# Patient Record
Sex: Female | Born: 1975 | Race: Asian | Hispanic: No | Marital: Married | Smoking: Former smoker
Health system: Southern US, Community
[De-identification: ages and names within clinical notes are randomized; demographics above are authoritative.]

## PROBLEM LIST (undated history)

## (undated) DIAGNOSIS — B019 Varicella without complication: Secondary | ICD-10-CM

## (undated) DIAGNOSIS — R32 Unspecified urinary incontinence: Secondary | ICD-10-CM

## (undated) DIAGNOSIS — G43909 Migraine, unspecified, not intractable, without status migrainosus: Secondary | ICD-10-CM

## (undated) DIAGNOSIS — K759 Inflammatory liver disease, unspecified: Secondary | ICD-10-CM

## (undated) DIAGNOSIS — N200 Calculus of kidney: Secondary | ICD-10-CM

## (undated) DIAGNOSIS — N390 Urinary tract infection, site not specified: Secondary | ICD-10-CM

## (undated) HISTORY — DX: Calculus of kidney: N20.0

## (undated) HISTORY — DX: Varicella without complication: B01.9

## (undated) HISTORY — DX: Unspecified urinary incontinence: R32

## (undated) HISTORY — DX: Inflammatory liver disease, unspecified: K75.9

## (undated) HISTORY — DX: Migraine, unspecified, not intractable, without status migrainosus: G43.909

## (undated) HISTORY — DX: Urinary tract infection, site not specified: N39.0

---

## 1997-05-23 ENCOUNTER — Inpatient Hospital Stay (HOSPITAL_COMMUNITY): Admission: RE | Admit: 1997-05-23 | Discharge: 1997-05-23 | Payer: Self-pay | Admitting: Obstetrics

## 1997-05-28 ENCOUNTER — Encounter (HOSPITAL_COMMUNITY)
Admission: RE | Admit: 1997-05-28 | Discharge: 1997-07-02 | Disposition: A | Discharge status: Home / Self Care | Payer: Self-pay | Attending: *Deleted | Admitting: *Deleted

## 1997-06-06 ENCOUNTER — Inpatient Hospital Stay (HOSPITAL_COMMUNITY): Admission: AD | Admit: 1997-06-06 | Discharge: 1997-06-06 | Payer: Self-pay | Admitting: Obstetrics

## 1997-06-28 ENCOUNTER — Inpatient Hospital Stay (HOSPITAL_COMMUNITY): Admission: AD | Admit: 1997-06-28 | Discharge: 1997-07-02 | Payer: Self-pay | Admitting: Obstetrics & Gynecology

## 1998-05-21 ENCOUNTER — Ambulatory Visit (HOSPITAL_COMMUNITY): Admission: RE | Admit: 1998-05-21 | Discharge: 1998-05-21 | Payer: Self-pay | Admitting: *Deleted

## 1998-06-13 ENCOUNTER — Ambulatory Visit (HOSPITAL_COMMUNITY): Admission: RE | Admit: 1998-06-13 | Discharge: 1998-06-13 | Payer: Self-pay | Admitting: Obstetrics

## 1998-08-16 ENCOUNTER — Inpatient Hospital Stay (HOSPITAL_COMMUNITY): Admission: AD | Admit: 1998-08-16 | Discharge: 1998-08-18 | Payer: Self-pay | Admitting: *Deleted

## 2000-05-10 ENCOUNTER — Ambulatory Visit (HOSPITAL_COMMUNITY): Admission: RE | Admit: 2000-05-10 | Discharge: 2000-05-10 | Payer: Self-pay | Admitting: Obstetrics

## 2007-10-26 ENCOUNTER — Ambulatory Visit (HOSPITAL_COMMUNITY): Admission: RE | Admit: 2007-10-26 | Discharge: 2007-10-26 | Payer: Self-pay | Admitting: Family Medicine

## 2008-03-10 ENCOUNTER — Inpatient Hospital Stay (HOSPITAL_COMMUNITY): Admission: AD | Admit: 2008-03-10 | Discharge: 2008-03-10 | Payer: Self-pay | Admitting: Obstetrics & Gynecology

## 2008-03-20 ENCOUNTER — Ambulatory Visit: Payer: Self-pay | Admitting: Obstetrics & Gynecology

## 2008-03-20 ENCOUNTER — Inpatient Hospital Stay (HOSPITAL_COMMUNITY): Admission: AD | Admit: 2008-03-20 | Discharge: 2008-03-22 | Payer: Self-pay | Admitting: Obstetrics & Gynecology

## 2008-04-20 HISTORY — PX: TUBAL LIGATION: SHX77

## 2008-10-25 ENCOUNTER — Emergency Department (HOSPITAL_COMMUNITY): Admission: EM | Admit: 2008-10-25 | Discharge: 2008-10-25 | Payer: Self-pay | Admitting: Emergency Medicine

## 2010-07-27 LAB — POCT PREGNANCY, URINE: Preg Test, Ur: NEGATIVE

## 2010-07-27 LAB — URINALYSIS, ROUTINE W REFLEX MICROSCOPIC
Bilirubin Urine: NEGATIVE
Glucose, UA: NEGATIVE mg/dL
Nitrite: POSITIVE — AB
Protein, ur: 30 mg/dL — AB
Specific Gravity, Urine: 1.026 (ref 1.005–1.030)
Urobilinogen, UA: 1 mg/dL (ref 0.0–1.0)
pH: 6 (ref 5.0–8.0)

## 2010-07-27 LAB — WET PREP, GENITAL
Trich, Wet Prep: NONE SEEN
Yeast Wet Prep HPF POC: NONE SEEN

## 2010-07-27 LAB — URINE MICROSCOPIC-ADD ON

## 2010-07-27 LAB — GC/CHLAMYDIA PROBE AMP, GENITAL
Chlamydia, DNA Probe: NEGATIVE
GC Probe Amp, Genital: NEGATIVE

## 2010-07-27 LAB — RPR: RPR Ser Ql: NONREACTIVE

## 2010-09-02 NOTE — Op Note (Signed)
Jody Small, Jody Small                 ACCOUNT NO.:  [id number]   MEDICAL RECORD NO.:  [id number]          PATIENT TYPE:  INP   LOCATION:  9146                          FACILITY:  [facility]   PHYSICIAN:  Norton Blizzard, MD    DATE OF BIRTH:  February 28, 1976   DATE OF PROCEDURE:  03/21/2008  DATE OF DISCHARGE:                               OPERATIVE REPORT   PREOPERATIVE DIAGNOSIS:  Multiparity, desiring sterilization.   POSTOPERATIVE DIAGNOSIS:  Multiparity, desiring sterilization.   PROCEDURE:  Postpartum bilateral tubal sterilization using Filshie  clips.   SURGEON:  Norton Blizzard, MD and Odie Sera, DO   ANESTHESIA:  Epidural and local analgesia in the form of 0.25% Marcaine,  15 mL injected around the incision at the end of the case.   IV FLUIDS:  800 mL of lactated Ringer's.   ESTIMATED BLOOD LOSS:  Minimal.   INDICATIONS:  The patient is a 35 year old gravida 4, para 4 with  undesired fertility.  The patient declined all other forms of reversible  and non-permanent birth control methods.  Prior to surgery, the risk of  bleeding, infection, injury to surrounding organs, need for additional  procedures, risk of failure of 5-7 in 1000 with increased risk of  ectopic gestation if pregnancy occurs were explained to the patient.  All questions were answered and written informed consent was obtained.  Of note, her Medicaid papers were signed on February 02, 2008.   FINDINGS:  Fundus below the umbilicus, normal fallopian tubes  bilaterally.   SPECIMENS:  None.   COMPLICATIONS:  None.   PROCEDURE DETAILS:  The patient received preoperative IV antibiotics in  the preoperative area.  She was then taken to the operating room where  her epidural was dosed up to a surgical level and found to be adequate.  An incision was made underneath the umbilicus about 3 cm in length and  transverse in fashion.  This incision was taken down to the layer of the  fascia using the scalpel and this  fascial incision was extended  bilaterally using Mayo scissors.  Attention was then turned to the  patient's uterus where the fallopian tubes bilaterally were recognized  and followed out to both fimbriated ends.  The right fallopian tube was  picked up with Tanja Port and a Filshie clip was placed about 3 cm from the  cornual region.  A similar process was carried out on the left side  providing total occlusion of both fallopian tubes.  Overall, good  hemostasis was noted.  All instruments were removed from the patient's  abdomen and the fascial incision was then reapproximated using 0-Vicryl  in a running stitch.  The subcutaneous tissues were then irrigated, and  skin was closed with 4-0 subcuticular stitch using Vicryl.  Local  analgesia in the form of 0.25% Marcaine which was injected around the  incision at the end of the case.  The patient tolerated the procedure  well.  Sponge, instrument, and needle counts were correct x2.  She was  taken to the recovery room awake and in stable condition.  Norton Blizzard, MD  Electronically Signed     UAD/MEDQ  D:  03/21/2008  T:  03/22/2008  Job:  [id number]

## 2010-12-28 ENCOUNTER — Emergency Department (HOSPITAL_COMMUNITY)
Admission: EM | Admit: 2010-12-28 | Discharge: 2010-12-29 | Disposition: A | Discharge status: Home / Self Care | Payer: 59 | Attending: Emergency Medicine | Admitting: Emergency Medicine

## 2010-12-28 DIAGNOSIS — F3289 Other specified depressive episodes: Secondary | ICD-10-CM | POA: Insufficient documentation

## 2010-12-28 DIAGNOSIS — F329 Major depressive disorder, single episode, unspecified: Secondary | ICD-10-CM | POA: Insufficient documentation

## 2010-12-28 DIAGNOSIS — T50901A Poisoning by unspecified drugs, medicaments and biological substances, accidental (unintentional), initial encounter: Secondary | ICD-10-CM | POA: Insufficient documentation

## 2010-12-28 DIAGNOSIS — F411 Generalized anxiety disorder: Secondary | ICD-10-CM | POA: Insufficient documentation

## 2010-12-28 DIAGNOSIS — T50904A Poisoning by unspecified drugs, medicaments and biological substances, undetermined, initial encounter: Secondary | ICD-10-CM | POA: Insufficient documentation

## 2010-12-28 DIAGNOSIS — D649 Anemia, unspecified: Secondary | ICD-10-CM | POA: Insufficient documentation

## 2010-12-29 LAB — DIFFERENTIAL
Basophils Relative: 0 % (ref 0–1)
Eosinophils Relative: 1 % (ref 0–5)
Lymphocytes Relative: 20 % (ref 12–46)
Lymphs Abs: 1.6 10*3/uL (ref 0.7–4.0)
Monocytes Relative: 6 % (ref 3–12)
Neutro Abs: 5.9 10*3/uL (ref 1.7–7.7)

## 2010-12-29 LAB — CBC
HCT: 29.5 % — ABNORMAL LOW (ref 36.0–46.0)
MCV: 61.6 fL — ABNORMAL LOW (ref 78.0–100.0)
Platelets: 392 10*3/uL (ref 150–400)
RBC: 4.79 MIL/uL (ref 3.87–5.11)
RDW: 18 % — ABNORMAL HIGH (ref 11.5–15.5)
WBC: 8.1 10*3/uL (ref 4.0–10.5)

## 2010-12-29 LAB — COMPREHENSIVE METABOLIC PANEL
ALT: 14 U/L (ref 0–35)
AST: 31 U/L (ref 0–37)
Albumin: 4.1 g/dL (ref 3.5–5.2)
Alkaline Phosphatase: 52 U/L (ref 39–117)
BUN: 15 mg/dL (ref 6–23)
Chloride: 102 mEq/L (ref 96–112)
Potassium: 3.5 mEq/L (ref 3.5–5.1)
Sodium: 134 mEq/L — ABNORMAL LOW (ref 135–145)
Total Bilirubin: 0.2 mg/dL — ABNORMAL LOW (ref 0.3–1.2)
Total Protein: 7.8 g/dL (ref 6.0–8.3)

## 2010-12-29 LAB — ACETAMINOPHEN LEVEL: Acetaminophen (Tylenol), Serum: <15 ug/mL (ref 10–30)

## 2010-12-29 LAB — RAPID URINE DRUG SCREEN, HOSP PERFORMED
Barbiturates: NOT DETECTED
Cocaine: NOT DETECTED

## 2011-01-23 LAB — CBC
Hemoglobin: 11.7 g/dL — ABNORMAL LOW (ref 12.0–15.0)
MCHC: 32.9 g/dL (ref 30.0–36.0)
Platelets: 228 10*3/uL (ref 150–400)
RDW: 15 % (ref 11.5–15.5)

## 2011-01-23 LAB — RPR: RPR Ser Ql: NONREACTIVE

## 2013-05-16 ENCOUNTER — Encounter: Payer: Self-pay | Admitting: Physician Assistant

## 2013-05-16 ENCOUNTER — Other Ambulatory Visit (INDEPENDENT_AMBULATORY_CARE_PROVIDER_SITE_OTHER): Payer: 59

## 2013-05-16 ENCOUNTER — Ambulatory Visit (INDEPENDENT_AMBULATORY_CARE_PROVIDER_SITE_OTHER): Payer: 59 | Admitting: Physician Assistant

## 2013-05-16 VITALS — BP 118/70 | HR 101 | Temp 100.0°F | Resp 20 | Ht 62.0 in | Wt 118.1 lb

## 2013-05-16 DIAGNOSIS — Z0000 Encounter for general adult medical examination without abnormal findings: Secondary | ICD-10-CM

## 2013-05-16 DIAGNOSIS — D509 Iron deficiency anemia, unspecified: Secondary | ICD-10-CM

## 2013-05-16 DIAGNOSIS — R059 Cough, unspecified: Secondary | ICD-10-CM

## 2013-05-16 DIAGNOSIS — R05 Cough: Secondary | ICD-10-CM

## 2013-05-16 LAB — HEPATIC FUNCTION PANEL
ALBUMIN: 3.8 g/dL (ref 3.5–5.2)
ALT: 15 U/L (ref 0–35)
AST: 24 U/L (ref 0–37)
Alkaline Phosphatase: 61 U/L (ref 39–117)
BILIRUBIN TOTAL: 0.4 mg/dL (ref 0.3–1.2)
Bilirubin, Direct: 0 mg/dL (ref 0.0–0.3)
TOTAL PROTEIN: 7.5 g/dL (ref 6.0–8.3)

## 2013-05-16 LAB — CBC WITH DIFFERENTIAL/PLATELET
BASOS ABS: 0 10*3/uL (ref 0.0–0.1)
Basophils Relative: 0.3 % (ref 0.0–3.0)
Eosinophils Absolute: 0.1 10*3/uL (ref 0.0–0.7)
Eosinophils Relative: 1.8 % (ref 0.0–5.0)
LYMPHS ABS: 1.6 10*3/uL (ref 0.7–4.0)
Lymphocytes Relative: 27 % (ref 12.0–46.0)
MCHC: 31 g/dL (ref 30.0–36.0)
MCV: 57.3 fl — ABNORMAL LOW (ref 78.0–100.0)
MONOS PCT: 6.9 % (ref 3.0–12.0)
Monocytes Absolute: 0.4 10*3/uL (ref 0.1–1.0)
NEUTROS ABS: 3.9 10*3/uL (ref 1.4–7.7)
Neutrophils Relative %: 64 % (ref 43.0–77.0)
Platelets: 388 10*3/uL (ref 150.0–400.0)
RBC: 4.16 Mil/uL (ref 3.87–5.11)
RDW: 18.5 % — AB (ref 11.5–14.6)
WBC: 6.1 10*3/uL (ref 4.5–10.5)

## 2013-05-16 LAB — BASIC METABOLIC PANEL
BUN: 13 mg/dL (ref 6–23)
CO2: 26 mEq/L (ref 19–32)
Calcium: 8.9 mg/dL (ref 8.4–10.5)
Chloride: 107 mEq/L (ref 96–112)
Creatinine, Ser: 0.6 mg/dL (ref 0.4–1.2)
GFR: 122.46 mL/min (ref >60.00)
Glucose, Bld: 114 mg/dL — ABNORMAL HIGH (ref 70–99)
POTASSIUM: 3.2 meq/L — AB (ref 3.5–5.1)
SODIUM: 137 meq/L (ref 135–145)

## 2013-05-16 LAB — URINALYSIS, ROUTINE W REFLEX MICROSCOPIC
BILIRUBIN URINE: NEGATIVE
Hgb urine dipstick: NEGATIVE
KETONES UR: NEGATIVE
LEUKOCYTES UA: NEGATIVE
Nitrite: NEGATIVE
PH: 6 (ref 5.0–8.0)
Specific Gravity, Urine: >=1.03 — AB (ref 1.000–1.030)
Total Protein, Urine: NEGATIVE
URINE GLUCOSE: NEGATIVE
Urobilinogen, UA: 0.2 (ref 0.0–1.0)

## 2013-05-16 LAB — LIPID PANEL
CHOLESTEROL: 141 mg/dL (ref 0–200)
HDL: 54.3 mg/dL (ref >39.00)
LDL CALC: 76 mg/dL (ref 0–99)
TRIGLYCERIDES: 53 mg/dL (ref 0.0–149.0)
Total CHOL/HDL Ratio: 3
VLDL: 10.6 mg/dL (ref 0.0–40.0)

## 2013-05-16 LAB — TSH: TSH: 0.97 u[IU]/mL (ref 0.35–5.50)

## 2013-05-16 NOTE — Progress Notes (Signed)
   Patient ID: Jody Small is a 38 y.o. female DOB: [phone]/[id number]/05 MRN: [id number]     HPI: patient is a 38 year old female who presents to the clinic today to establish care. Patient states she has not had a PCP in a number of years. Reports generally in good health. 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 Reports history of migraine which resolve easily with Excedrin Migraine medication and rest. States she has had a cough for the last two months which is sometimes productive of mucus, color unknown. States she has had an increase in fatigue also. Reports she was seen at local urgent care, informed had URI and provided Rx for inhaler and nasal spray. Patient states she has had no improvement of symptoms since. Denies nasal congestion/drainage, ear pain/pressure, facial pain, chest pain/palpitations, SOB, history of allergies/asthma N/V, diarrhea or constipation. Patient would like referral to gynecology.  ROS: As stated in HPI. All other systems negative  PE: CONSTITUTIONAL: Well developed, well nourished, pleasant, appears stated age, in NAD HEENT: normocephalic, atraumatic, bilateral ext/int canals normal. Bilateral TM's without injections, bulging, erythema. Nose normal, uvula midline, oropharynx clear and moist. EYES: PERRLA, bilateral EOM and conjunctiva normal NECK: FROM, supple, without thyromegaly or mass CARDIO: RRR, normal S1 and S2, distal pulses intact. PULM/CHEST CTA bilateral, no wheezes, rales or rhonchi. Non tender. ABD: appearance normal, soft, nontender. Normal bowel sounds x 4 quadrants GU: deferred to GYN MUSC: FROM U/LE bilateral LYMPH: no cervical, supraclavicular adenopathy NEURO: alert and oriented x 3, no cranial nerve deficit, motor strength and coordination NL. Negative romberg. Gait normal. SKIN: warm, dry, no rash or lesions noted. PSYCH: Mood and affect normal, speech normal.  Past Medical History  Diagnosis Date  . Chicken pox   . Hepatitis   . Migraines    . Kidney stones   . Urine incontinence   . UTI (urinary tract infection)    History reviewed. No pertinent family history. History   Social History  . Marital Status: Married    Spouse Name: N/A    Number of Children: N/A  . Years of Education: N/A   Social History Main Topics  . Smoking status: Former Smoker    Quit date: 05/16/1998  . Smokeless tobacco: None  . Alcohol Use: .5 - 1.5 oz/week    1-3 drink(s) per week     Comment: Social  . Drug Use: No  . Sexual Activity: Yes    Partners: Male    Birth Control/ Protection: Other-see comments     Comment: Tube Tie 5 yrs ago   Other Topics Concern  . None   Social History Narrative  . None   Past Surgical History  Procedure Laterality Date  . Tubal ligation  2010    ASSESSMENT and PLAN   CPX/v70.0 - Patient has been counseled on age-appropriate routine health concerns for screening and prevention. These are reviewed and up-to-date. Immunizations are up-to-date or declined. Labs ordered and will be reviewed.  Questioned patient on diet for today, states all she has had is a bowl of rice with fish paste, still ordered Lipid panel.  Cough: Patient febrile, 100 *F, and tachy at 101 bpm, ordered cxr, informed patient if necessary will eRx for antibiotics. Otherwise symptomatic treatment with previously prescribed inhaler along with cough suppressant and Claritin.   Patient stated understanding and agreement of diagnosis and treatment.

## 2013-05-16 NOTE — Patient Instructions (Signed)
It was great meeting you today Jody Small!   Cough, Adult  A cough is a reflex. It helps you clear your throat and airways. A cough can help heal your body. A cough can last 2 or 3 weeks (acute) or may last more than 8 weeks (chronic). Some common causes of a cough can include an infection, allergy, or a cold. HOME CARE  Only take medicine as told by your doctor.  If given, take your medicines (antibiotics) as told. Finish them even if you start to feel better.  Use a cold steam vaporizer or humidier in your home. This can help loosen thick spit (secretions).  Sleep so you are almost sitting up (semi-upright). Use pillows to do this. This helps reduce coughing.  Rest as needed.  Stop smoking if you smoke. GET HELP RIGHT AWAY IF:  You have yellowish-white fluid (pus) in your thick spit.  Your cough gets worse.  Your medicine does not reduce coughing, and you are losing sleep.  You cough up blood.  You have trouble breathing.  Your pain gets worse and medicine does not help.  You have a fever. MAKE SURE YOU:   Understand these instructions.  Will watch your condition.  Will get help right away if you are not doing well or get worse. Document Released: 12/18/2010 Document Revised: 06/29/2011 Document Reviewed: 12/18/2010 [facility] Patient Information 2014 [place].    Health Maintenance, Female A healthy lifestyle and preventative care can promote health and wellness.  Maintain regular health, dental, and eye exams.  Eat a healthy diet. Foods like vegetables, fruits, whole grains, low-fat dairy products, and lean protein foods contain the nutrients you need without too many calories. Decrease your intake of foods high in solid fats, added sugars, and salt. Get information about a proper diet from your caregiver, if necessary.  Regular physical exercise is one of the most important things you can do for your health. Most adults should get at least 150 minutes of  moderate-intensity exercise (any activity that increases your heart rate and causes you to sweat) each week. In addition, most adults need muscle-strengthening exercises on 2 or more days a week.   Maintain a healthy weight. The body mass index (BMI) is a screening tool to identify possible weight problems. It provides an estimate of body fat based on height and weight. Your caregiver can help determine your BMI, and can help you achieve or maintain a healthy weight. For adults 20 years and older:  A BMI below 18.5 is considered underweight.  A BMI of 18.5 to 24.9 is normal.  A BMI of 25 to 29.9 is considered overweight.  A BMI of 30 and above is considered obese.  Maintain normal blood lipids and cholesterol by exercising and minimizing your intake of saturated fat. Eat a balanced diet with plenty of fruits and vegetables. Blood tests for lipids and cholesterol should begin at age 59 and be repeated every 5 years. If your lipid or cholesterol levels are high, you are over 50, or you are a high risk for heart disease, you may need your cholesterol levels checked more frequently.Ongoing high lipid and cholesterol levels should be treated with medicines if diet and exercise are not effective.  If you smoke, find out from your caregiver how to quit. If you do not use tobacco, do not start.  Lung cancer screening is recommended for adults aged 47 80 years who are at high risk for developing lung cancer because of a history  of smoking. Yearly low-dose computed tomography (CT) is recommended for people who have at least a 30-pack-year history of smoking and are a current smoker or have quit within the past 15 years. A pack year of smoking is smoking an average of 1 pack of cigarettes a day for 1 year (for example: 1 pack a day for 30 years or 2 packs a day for 15 years). Yearly screening should continue until the smoker has stopped smoking for at least 15 years. Yearly screening should also be stopped  for people who develop a health problem that would prevent them from having lung cancer treatment.  If you are pregnant, do not drink alcohol. If you are breastfeeding, be very cautious about drinking alcohol. If you are not pregnant and choose to drink alcohol, do not exceed 1 drink per day. One drink is considered to be 12 ounces (355 mL) of beer, 5 ounces (148 mL) of wine, or 1.5 ounces (44 mL) of liquor.  Avoid use of street drugs. Do not share needles with anyone. Ask for help if you need support or instructions about stopping the use of drugs.  High blood pressure causes heart disease and increases the risk of stroke. Blood pressure should be checked at least every 1 to 2 years. Ongoing high blood pressure should be treated with medicines, if weight loss and exercise are not effective.  If you are 9 to 37 years old, ask your caregiver if you should take aspirin to prevent strokes.  Diabetes screening involves taking a blood sample to check your fasting blood sugar level. This should be done once every 3 years, after age 39, if you are within normal weight and without risk factors for diabetes. Testing should be considered at a younger age or be carried out more frequently if you are overweight and have at least 1 risk factor for diabetes.  Breast cancer screening is essential preventative care for women. You should practice "breast self-awareness." This means understanding the normal appearance and feel of your breasts and may include breast self-examination. Any changes detected, no matter how small, should be reported to a caregiver. Women in their 88s and 30s should have a clinical breast exam (CBE) by a caregiver as part of a regular health exam every 1 to 3 years. After age 30, women should have a CBE every year. Starting at age 26, women should consider having a mammogram (breast X-ray) every year. Women who have a family history of breast cancer should talk to their caregiver about genetic  screening. Women at a high risk of breast cancer should talk to their caregiver about having an MRI and a mammogram every year.  Breast cancer gene (BRCA)-related cancer risk assessment is recommended for women who have family members with BRCA-related cancers. BRCA-related cancers include breast, ovarian, tubal, and peritoneal cancers. Having family members with these cancers may be associated with an increased risk for harmful changes (mutations) in the breast cancer genes BRCA1 and BRCA2. Results of the assessment will determine the need for genetic counseling and BRCA1 and BRCA2 testing.  The Pap test is a screening test for cervical cancer. Women should have a Pap test starting at age 88. Between ages 16 and 39, Pap tests should be repeated every 2 years. Beginning at age 73, you should have a Pap test every 3 years as long as the past 3 Pap tests have been normal. If you had a hysterectomy for a problem that was not cancer or a condition  that could lead to cancer, then you no longer need Pap tests. If you are between ages 37 and 77, and you have had normal Pap tests going back 10 years, you no longer need Pap tests. If you have had past treatment for cervical cancer or a condition that could lead to cancer, you need Pap tests and screening for cancer for at least 20 years after your treatment. If Pap tests have been discontinued, risk factors (such as a new sexual partner) need to be reassessed to determine if screening should be resumed. Some women have medical problems that increase the chance of getting cervical cancer. In these cases, your caregiver may recommend more frequent screening and Pap tests.  The human papillomavirus (HPV) test is an additional test that may be used for cervical cancer screening. The HPV test looks for the virus that can cause the cell changes on the cervix. The cells collected during the Pap test can be tested for HPV. The HPV test could be used to screen women aged 66  years and older, and should be used in women of any age who have unclear Pap test results. After the age of 1, women should have HPV testing at the same frequency as a Pap test.  Colorectal cancer can be detected and often prevented. Most routine colorectal cancer screening begins at the age of 68 and continues through age 51. However, your caregiver may recommend screening at an earlier age if you have risk factors for colon cancer. On a yearly basis, your caregiver may provide home test kits to check for hidden blood in the stool. Use of a small camera at the end of a tube, to directly examine the colon (sigmoidoscopy or colonoscopy), can detect the earliest forms of colorectal cancer. Talk to your caregiver about this at age 28, when routine screening begins. Direct examination of the colon should be repeated every 5 to 10 years through age 46, unless early forms of pre-cancerous polyps or small growths are found.  Hepatitis C blood testing is recommended for all people born from [birth year] through 1964 and any individual with known risks for hepatitis C.  Practice safe sex. Use condoms and avoid high-risk sexual practices to reduce the spread of sexually transmitted infections (STIs). Sexually active women aged 34 and younger should be checked for Chlamydia, which is a common sexually transmitted infection. Older women with new or multiple partners should also be tested for Chlamydia. Testing for other STIs is recommended if you are sexually active and at increased risk.  Osteoporosis is a disease in which the bones lose minerals and strength with aging. This can result in serious bone fractures. The risk of osteoporosis can be identified using a bone density scan. Women ages 69 and over and women at risk for fractures or osteoporosis should discuss screening with their caregivers. Ask your caregiver whether you should be taking a calcium supplement or vitamin D to reduce the rate of  osteoporosis.  Menopause can be associated with physical symptoms and risks. Hormone replacement therapy is available to decrease symptoms and risks. You should talk to your caregiver about whether hormone replacement therapy is right for you.  Use sunscreen. Apply sunscreen liberally and repeatedly throughout the day. You should seek shade when your shadow is shorter than you. Protect yourself by wearing long sleeves, pants, a wide-brimmed hat, and sunglasses year round, whenever you are outdoors.  Notify your caregiver of new moles or changes in moles, especially if there is a  change in shape or color. Also notify your caregiver if a mole is larger than the size of a pencil eraser.  Stay current with your immunizations. Document Released: 10/20/2010 Document Revised: 08/01/2012 Document Reviewed: 10/20/2010 [facility] Patient Information 2014 [place].

## 2013-05-16 NOTE — Progress Notes (Signed)
Pre-visit discussion using our clinic review tool. No additional management support is needed unless otherwise documented below in the visit note.  

## 2013-05-18 ENCOUNTER — Ambulatory Visit (INDEPENDENT_AMBULATORY_CARE_PROVIDER_SITE_OTHER)
Admission: RE | Admit: 2013-05-18 | Discharge: 2013-05-18 | Disposition: A | Discharge status: Home / Self Care | Payer: 59 | Source: Ambulatory Visit | Attending: Physician Assistant | Admitting: Physician Assistant

## 2013-05-18 DIAGNOSIS — R05 Cough: Secondary | ICD-10-CM

## 2013-05-18 DIAGNOSIS — R059 Cough, unspecified: Secondary | ICD-10-CM

## 2013-05-18 MED ORDER — FERROUS GLUCONATE 324 (38 FE) MG PO TABS: 324.0000 mg | ORAL_TABLET | Freq: Three times a day (TID) | ORAL | Status: AC

## 2013-05-18 NOTE — Addendum Note (Signed)
Addended by: Ascencion DikeHARTMAN, Annlee Glandon K on: 05/18/2013 05:33 PM   Modules accepted: Orders

## 2013-05-19 ENCOUNTER — Telehealth: Payer: Self-pay

## 2013-05-19 NOTE — Telephone Encounter (Signed)
Message copied by Basilia JumboALLEN, Dailynn Nancarrow on Fri May 19, 2013 10:24 AM ------      Message from: Ascencion DikeHARTMAN, NANCY K      Created: Thu May 18, 2013  5:24 PM       Please phone patient and inform her chest xray is normal, no pneumonia. Also inform her she is slightly anemic will send Rx to her pharmacy for Iron supplement. ------

## 2013-07-05 ENCOUNTER — Encounter: Payer: Self-pay | Admitting: Obstetrics & Gynecology

## 2015-01-28 IMAGING — CR DG CHEST 2V
2 series · 2 of 2 positions shown · non-contrast
Comparison: None.

CLINICAL DATA: Cough and shortness of breath for 3 months.

EXAM:
CHEST  2 VIEW

[view not recorded (1 of 2)]
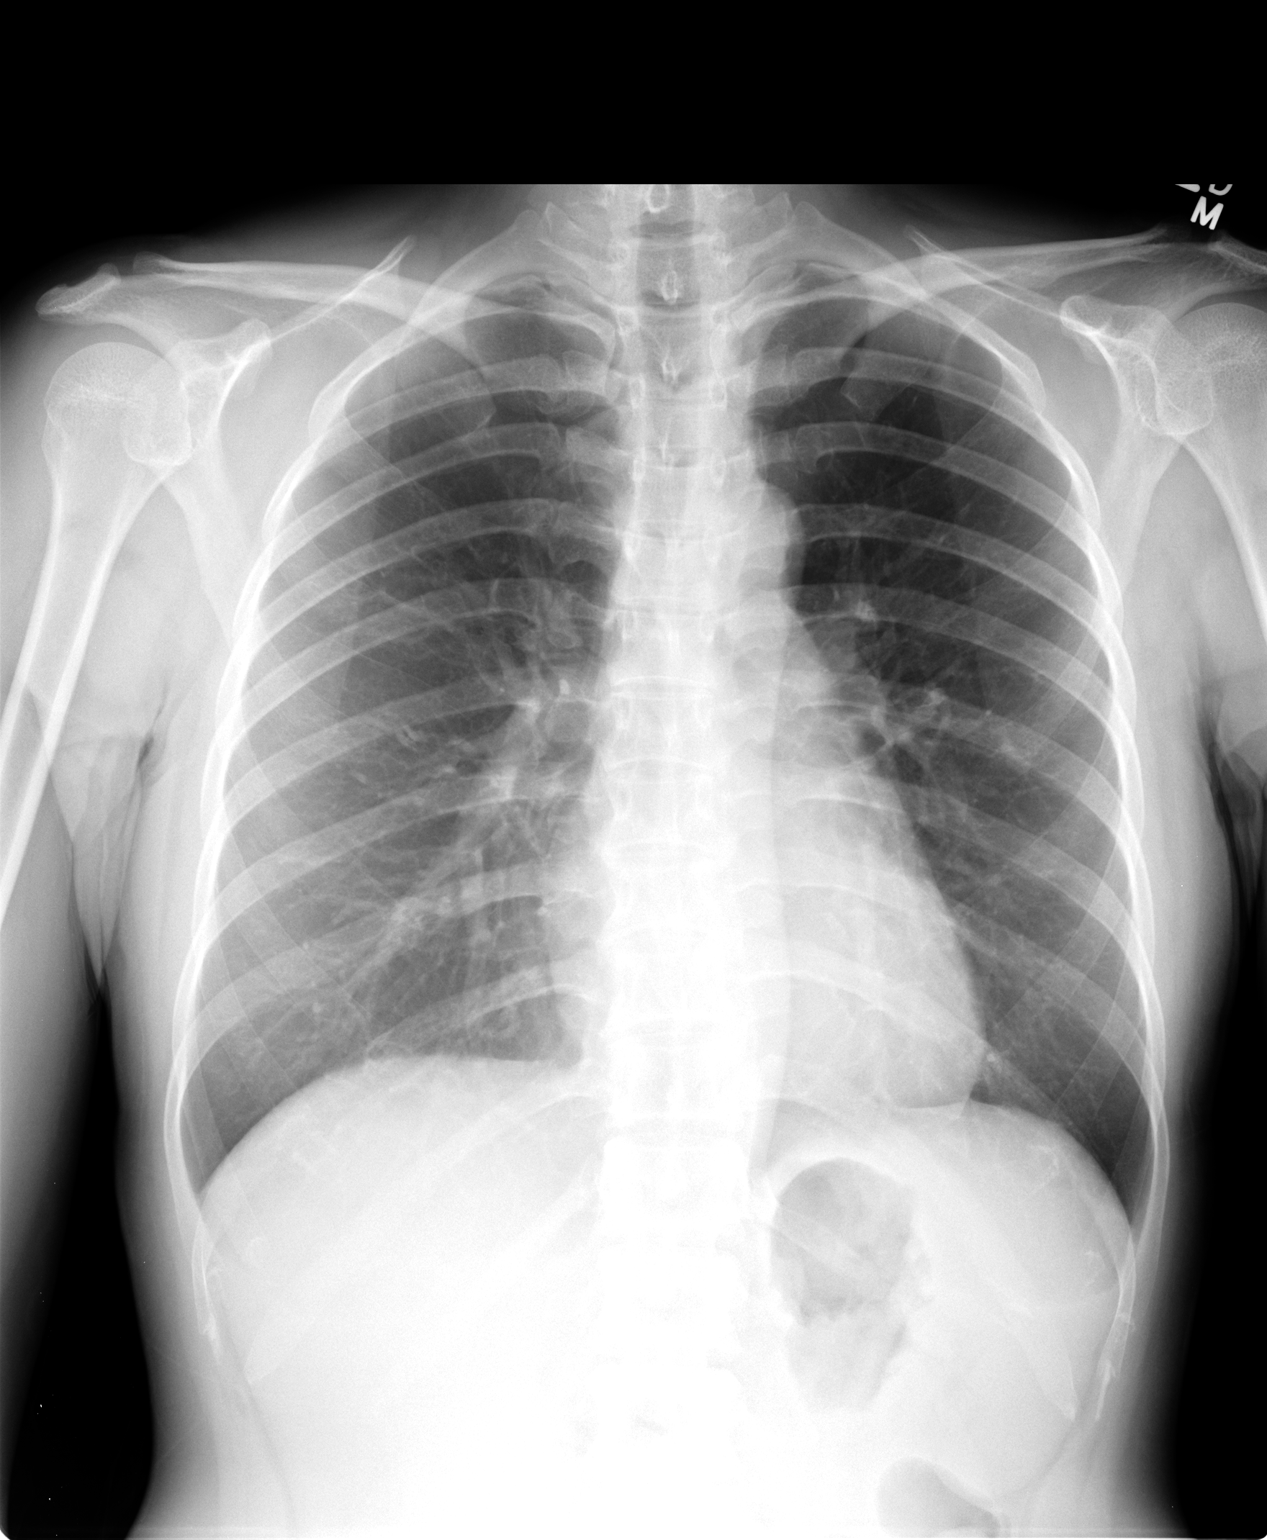

[view not recorded (2 of 2)]
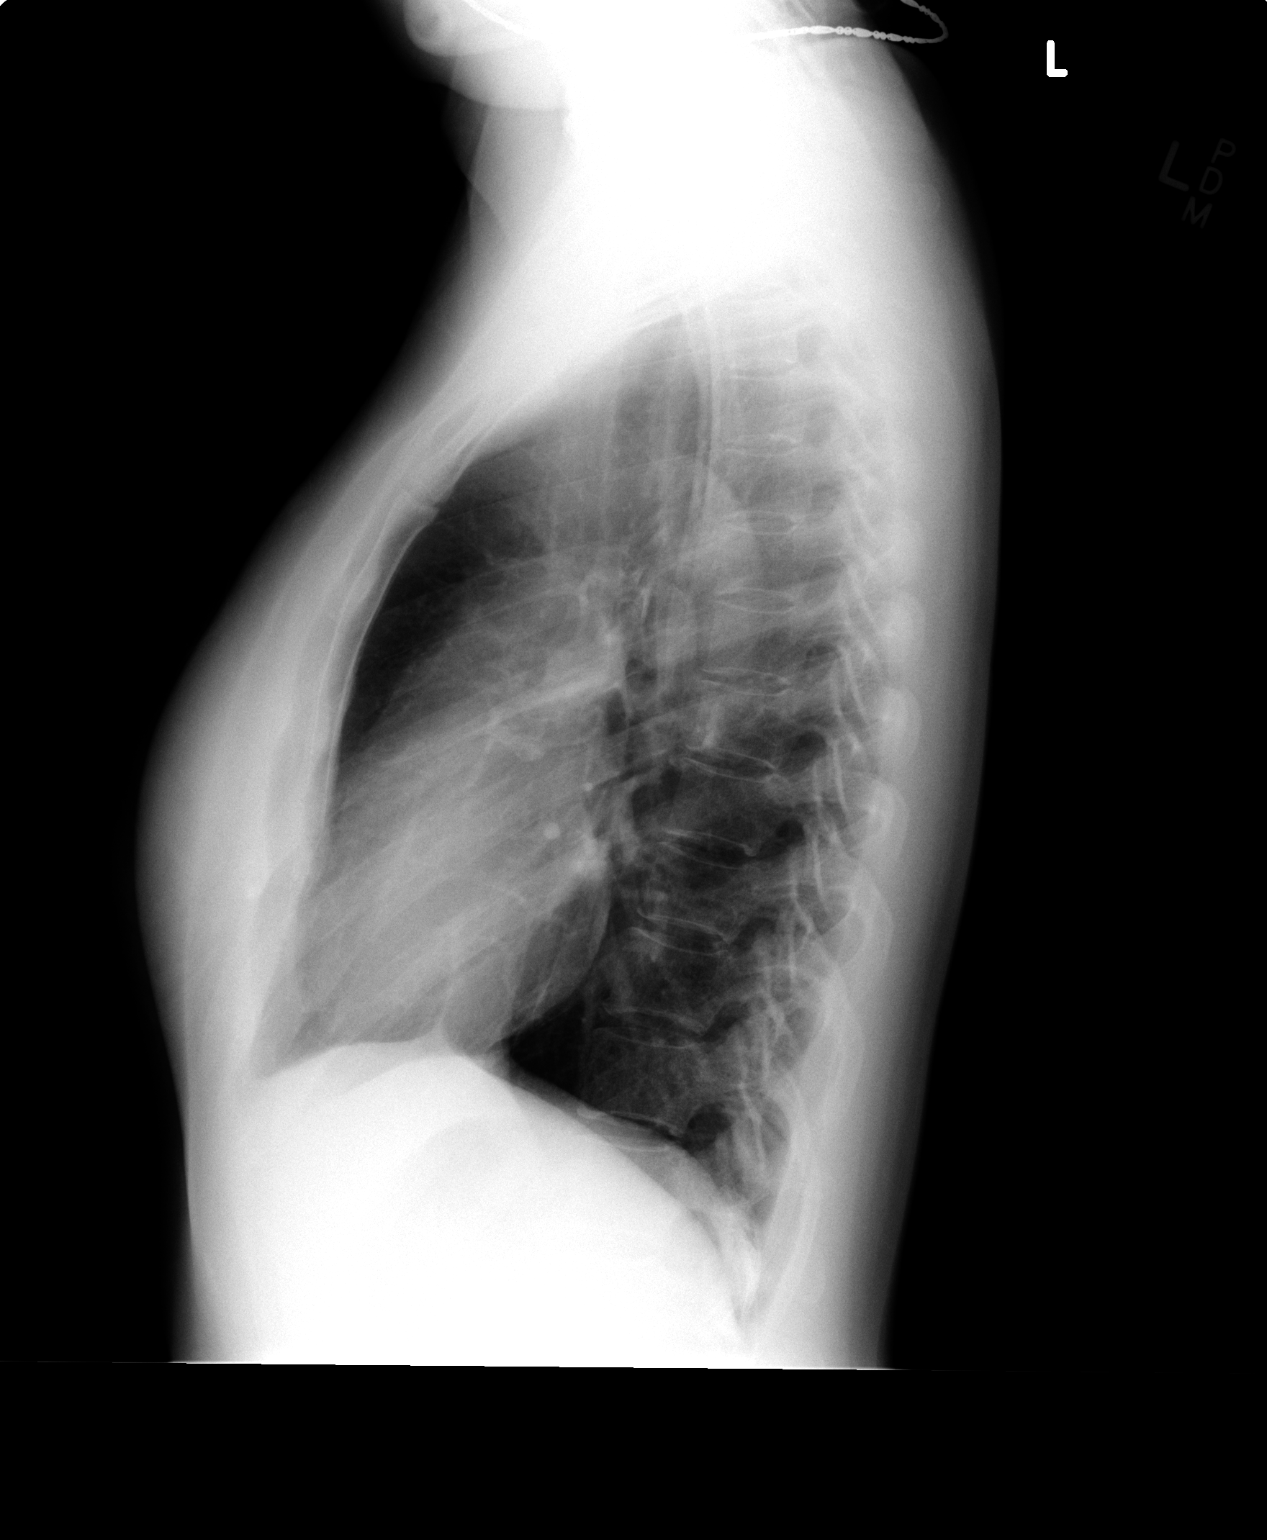

[2 of 2 positions shown; findings below may reference images not displayed]

FINDINGS: Heart size and mediastinal contours are within normal limits. Both
lungs are clear. Visualized skeletal structures are unremarkable.
IMPRESSION: Negative exam.

## 2020-04-27 ENCOUNTER — Other Ambulatory Visit: Payer: Self-pay

## 2020-04-27 DIAGNOSIS — R69 Illness, unspecified: Secondary | ICD-10-CM

## 2020-04-30 LAB — NOVEL CORONAVIRUS, NAA: SARS-CoV-2, NAA: DETECTED — AB
